# Patient Record
Sex: Female | Born: 1980 | Hispanic: No | Marital: Married | Smoking: Never smoker
Health system: Southern US, Community
[De-identification: ages and names within clinical notes are randomized; demographics above are authoritative.]

## PROBLEM LIST (undated history)

## (undated) ENCOUNTER — Inpatient Hospital Stay (HOSPITAL_COMMUNITY): Payer: Self-pay

## (undated) DIAGNOSIS — Z789 Other specified health status: Secondary | ICD-10-CM

---

## 2013-05-08 NOTE — L&D Delivery Note (Signed)
Delivery Note At 10:12 AM a healthy female was delivered via VBAC, Spontaneous (Presentation: ;  ).  APGAR: 9, 9; weight .   Placenta status: Intact, Spontaneous.  Cord: 3 vessels with the following complications: None.  Cord pH: Not done  Anesthesia: Epidural  Episiotomy: None Lacerations: 2nd degree Suture Repair: 2.0 3.0 vicryl rapide Est. Blood Loss (mL): 400  Mom to postpartum.  Baby to Couplet care / Skin to Skin.  Shannon Kirkendall,MARIE-LYNE 01/02/2014, 11:00 AM

## 2013-08-15 LAB — OB RESULTS CONSOLE HEPATITIS B SURFACE ANTIGEN: Hepatitis B Surface Ag: NEGATIVE

## 2013-08-15 LAB — OB RESULTS CONSOLE RPR: RPR: NONREACTIVE

## 2013-08-15 LAB — OB RESULTS CONSOLE ANTIBODY SCREEN: Antibody Screen: NEGATIVE

## 2013-08-15 LAB — OB RESULTS CONSOLE HIV ANTIBODY (ROUTINE TESTING)
HIV: NONREACTIVE
HIV: NONREACTIVE

## 2013-08-15 LAB — OB RESULTS CONSOLE RUBELLA ANTIBODY, IGM: Rubella: IMMUNE

## 2013-08-21 LAB — OB RESULTS CONSOLE GC/CHLAMYDIA
Chlamydia: NEGATIVE
Chlamydia: NEGATIVE
GC PROBE AMP, GENITAL: NEGATIVE
Gonorrhea: NEGATIVE

## 2013-09-01 LAB — OB RESULTS CONSOLE ABO/RH: RH Type: POSITIVE

## 2013-10-21 LAB — OB RESULTS CONSOLE GBS: STREP GROUP B AG: NEGATIVE

## 2013-10-21 LAB — OB RESULTS CONSOLE RPR: RPR: NONREACTIVE

## 2013-12-29 ENCOUNTER — Other Ambulatory Visit: Payer: Self-pay | Admitting: Obstetrics & Gynecology

## 2014-01-01 ENCOUNTER — Inpatient Hospital Stay (HOSPITAL_COMMUNITY)
Admission: AD | Admit: 2014-01-01 | Discharge: 2014-01-04 | DRG: 775 | Disposition: A | Discharge status: Home / Self Care | Payer: 59 | Source: Ambulatory Visit | Attending: Obstetrics & Gynecology | Admitting: Obstetrics & Gynecology

## 2014-01-01 ENCOUNTER — Encounter (HOSPITAL_COMMUNITY): Payer: Self-pay

## 2014-01-01 DIAGNOSIS — O34219 Maternal care for unspecified type scar from previous cesarean delivery: Secondary | ICD-10-CM | POA: Diagnosis present

## 2014-01-01 DIAGNOSIS — O99891 Other specified diseases and conditions complicating pregnancy: Secondary | ICD-10-CM | POA: Diagnosis present

## 2014-01-01 DIAGNOSIS — D62 Acute posthemorrhagic anemia: Secondary | ICD-10-CM | POA: Diagnosis present

## 2014-01-01 DIAGNOSIS — O9903 Anemia complicating the puerperium: Secondary | ICD-10-CM | POA: Diagnosis present

## 2014-01-01 HISTORY — DX: Other specified health status: Z78.9

## 2014-01-01 LAB — CBC
HEMATOCRIT: 37.8 % (ref 36.0–46.0)
Hemoglobin: 12.2 g/dL (ref 12.0–15.0)
MCH: 30.5 pg (ref 26.0–34.0)
MCHC: 32.3 g/dL (ref 30.0–36.0)
MCV: 94.5 fL (ref 78.0–100.0)
PLATELETS: 168 10*3/uL (ref 150–400)
RBC: 4 MIL/uL (ref 3.87–5.11)
RDW: 13.7 % (ref 11.5–15.5)
WBC: 9.3 10*3/uL (ref 4.0–10.5)

## 2014-01-01 LAB — TYPE AND SCREEN
ABO/RH(D): B POS
Antibody Screen: NEGATIVE

## 2014-01-01 MED ORDER — PHENYLEPHRINE 40 MCG/ML (10ML) SYRINGE FOR IV PUSH (FOR BLOOD PRESSURE SUPPORT)
80.0000 ug | PREFILLED_SYRINGE | INTRAVENOUS | Status: DC | PRN
  Filled 2014-01-01: qty 2

## 2014-01-01 MED ORDER — LIDOCAINE HCL (PF) 1 % IJ SOLN
30.0000 mL | INTRAMUSCULAR | Status: DC | PRN
  Administered 2014-01-02: 30 mL via SUBCUTANEOUS
  Filled 2014-01-01: qty 30

## 2014-01-01 MED ORDER — PHENYLEPHRINE 40 MCG/ML (10ML) SYRINGE FOR IV PUSH (FOR BLOOD PRESSURE SUPPORT)
80.0000 ug | PREFILLED_SYRINGE | INTRAVENOUS | Status: DC | PRN
  Filled 2014-01-01: qty 2
  Filled 2014-01-01: qty 10

## 2014-01-01 MED ORDER — DIPHENHYDRAMINE HCL 50 MG/ML IJ SOLN: 12.5000 mg | INTRAMUSCULAR | Status: DC | PRN

## 2014-01-01 MED ORDER — EPHEDRINE 5 MG/ML INJ
10.0000 mg | INTRAVENOUS | Status: DC | PRN
  Filled 2014-01-01: qty 2

## 2014-01-01 MED ORDER — OXYTOCIN 40 UNITS IN LACTATED RINGERS INFUSION - SIMPLE MED
62.5000 mL/h | INTRAVENOUS | Status: DC
  Administered 2014-01-02: 62.5 mL/h via INTRAVENOUS

## 2014-01-01 MED ORDER — FENTANYL 2.5 MCG/ML BUPIVACAINE 1/10 % EPIDURAL INFUSION (WH - ANES)
14.0000 mL/h | INTRAMUSCULAR | Status: DC | PRN
  Administered 2014-01-02: 14 mL/h via EPIDURAL
  Filled 2014-01-01 (×2): qty 125

## 2014-01-01 MED ORDER — IBUPROFEN 600 MG PO TABS: 600.0000 mg | ORAL_TABLET | Freq: Four times a day (QID) | ORAL | Status: DC | PRN

## 2014-01-01 MED ORDER — LACTATED RINGERS IV SOLN
INTRAVENOUS | Status: DC
  Administered 2014-01-02 (×2): via INTRAVENOUS

## 2014-01-01 MED ORDER — OXYTOCIN BOLUS FROM INFUSION: 500.0000 mL | INTRAVENOUS | Status: DC

## 2014-01-01 MED ORDER — ACETAMINOPHEN 325 MG PO TABS: 650.0000 mg | ORAL_TABLET | ORAL | Status: DC | PRN

## 2014-01-01 MED ORDER — CITRIC ACID-SODIUM CITRATE 334-500 MG/5ML PO SOLN
30.0000 mL | ORAL | Status: DC | PRN
  Filled 2014-01-01: qty 15

## 2014-01-01 MED ORDER — OXYCODONE-ACETAMINOPHEN 5-325 MG PO TABS: 1.0000 | ORAL_TABLET | ORAL | Status: DC | PRN

## 2014-01-01 MED ORDER — LACTATED RINGERS IV SOLN
500.0000 mL | Freq: Once | INTRAVENOUS | Status: AC
  Administered 2014-01-02: 500 mL via INTRAVENOUS

## 2014-01-01 MED ORDER — LACTATED RINGERS IV SOLN: 500.0000 mL | INTRAVENOUS | Status: DC | PRN

## 2014-01-01 MED ORDER — OXYTOCIN 40 UNITS IN LACTATED RINGERS INFUSION - SIMPLE MED
1.0000 m[IU]/min | INTRAVENOUS | Status: DC
  Administered 2014-01-01: 4 m[IU]/min via INTRAVENOUS
  Administered 2014-01-01: 2 m[IU]/min via INTRAVENOUS
  Administered 2014-01-01: 6 m[IU]/min via INTRAVENOUS
  Filled 2014-01-01: qty 1000

## 2014-01-01 MED ORDER — TERBUTALINE SULFATE 1 MG/ML IJ SOLN: 0.2500 mg | Freq: Once | INTRAMUSCULAR | Status: AC | PRN

## 2014-01-01 MED ORDER — ONDANSETRON HCL 4 MG/2ML IJ SOLN: 4.0000 mg | Freq: Four times a day (QID) | INTRAMUSCULAR | Status: DC | PRN

## 2014-01-01 NOTE — H&P (Signed)
Sandra Berry is a 33 y.o. female G2P1 [redacted]w[redacted]d presenting for AF leak.  HPI:  Clear AF leak x this am.  Mild irregular UCs.  Good FMs.  No vaginal bleeding.  No PIH Sxs.  OB History   Grav Para Term Preterm Abortions TAB SAB Ect Mult Living       History reviewed. No pertinent past medical history. Past Surgical History  Procedure Laterality Date  . Cesarean section     Family History: family history is not on file. Social History:  reports that she has never smoked. She does not have any smokeless tobacco history on file. Her alcohol and drug histories are not on file. No current facility-administered medications for this encounter. No Known Allergies  Dilation: 2 Effacement (%): 80 Station: -1 Exam by:: Dr. Seymour Bars Blood pressure 81/59, pulse 83, temperature 98.2 F (36.8 C), temperature source Oral, resp. rate 16, height  (1.549 m), weight 69.854 kg (154 lb). Exam Physical Exam  AF clear FHR 130's accelerations present, no deceleration UCs q3-5 min mild  HPP: There are no active problems to display for this patient.   Prenatal labs: ABO, Rh: B/Positive/-- (04/27 0000) Antibody: Negative (04/10 0000) Rubella:  Immune RPR: Nonreactive (06/16 0000)  HBsAg: Negative (04/10 0000)  HIV: Non-reactive, Non-reactive (04/10 0000)  Genetic testing: Not done, 1st visit at 17+ wks [place] anato: wnl 1 hr GTT: wnl GBS: Negative (06/16 0000)  Last [place] EFW 76%, AFI wnl  Assessment/Plan: 38+ wks SROM clear in early labor.  Pitocin augmentation.  Fetal well-being reassuring.  Attempting VBAC.  Monitoring.  Epidural PRN.   Tiffony Kite,MARIE-LYNE 01/01/2014, 8:04 PM

## 2014-01-02 ENCOUNTER — Encounter (HOSPITAL_COMMUNITY): Payer: Self-pay | Admitting: General Practice

## 2014-01-02 ENCOUNTER — Encounter (HOSPITAL_COMMUNITY): Payer: 59 | Admitting: Anesthesiology

## 2014-01-02 ENCOUNTER — Inpatient Hospital Stay (HOSPITAL_COMMUNITY): Payer: 59 | Admitting: Anesthesiology

## 2014-01-02 LAB — ABO/RH: ABO/RH(D): B POS

## 2014-01-02 LAB — RPR

## 2014-01-02 MED ORDER — SIMETHICONE 80 MG PO CHEW: 80.0000 mg | CHEWABLE_TABLET | ORAL | Status: DC | PRN

## 2014-01-02 MED ORDER — WITCH HAZEL-GLYCERIN EX PADS: 1.0000 "application " | MEDICATED_PAD | CUTANEOUS | Status: DC | PRN

## 2014-01-02 MED ORDER — BENZOCAINE-MENTHOL 20-0.5 % EX AERO
1.0000 | INHALATION_SPRAY | CUTANEOUS | Status: DC | PRN
Start: 2014-01-02 — End: 2014-01-04
  Administered 2014-01-02: 1 via TOPICAL
  Filled 2014-01-02: qty 56

## 2014-01-02 MED ORDER — ZOLPIDEM TARTRATE 5 MG PO TABS: 5.0000 mg | ORAL_TABLET | Freq: Every evening | ORAL | Status: DC | PRN

## 2014-01-02 MED ORDER — LIDOCAINE HCL (PF) 1 % IJ SOLN
INTRAMUSCULAR | Status: DC | PRN
  Administered 2014-01-02 (×2): 8 mL

## 2014-01-02 MED ORDER — ONDANSETRON HCL 4 MG PO TABS
4.0000 mg | ORAL_TABLET | ORAL | Status: DC | PRN
Start: 2014-01-02 — End: 2014-01-03

## 2014-01-02 MED ORDER — LANOLIN HYDROUS EX OINT: TOPICAL_OINTMENT | CUTANEOUS | Status: DC | PRN

## 2014-01-02 MED ORDER — IBUPROFEN 600 MG PO TABS
600.0000 mg | ORAL_TABLET | Freq: Four times a day (QID) | ORAL | Status: DC
  Administered 2014-01-02 – 2014-01-04 (×7): 600 mg via ORAL
  Filled 2014-01-02 (×8): qty 1

## 2014-01-02 MED ORDER — OXYTOCIN 40 UNITS IN LACTATED RINGERS INFUSION - SIMPLE MED
62.5000 mL/h | INTRAVENOUS | Status: DC | PRN
Start: 2014-01-02 — End: 2014-01-03

## 2014-01-02 MED ORDER — OXYCODONE-ACETAMINOPHEN 5-325 MG PO TABS
1.0000 | ORAL_TABLET | ORAL | Status: DC | PRN
  Administered 2014-01-02 – 2014-01-04 (×5): 1 via ORAL
  Filled 2014-01-02 (×6): qty 1

## 2014-01-02 MED ORDER — FENTANYL 2.5 MCG/ML BUPIVACAINE 1/10 % EPIDURAL INFUSION (WH - ANES)
INTRAMUSCULAR | Status: DC | PRN
  Administered 2014-01-02: 14 mL/h via EPIDURAL

## 2014-01-02 MED ORDER — TETANUS-DIPHTH-ACELL PERTUSSIS 5-2.5-18.5 LF-MCG/0.5 IM SUSP
0.5000 mL | Freq: Once | INTRAMUSCULAR | Status: DC
Start: 2014-01-03 — End: 2014-01-03

## 2014-01-02 MED ORDER — DIPHENHYDRAMINE HCL 25 MG PO CAPS
25.0000 mg | ORAL_CAPSULE | Freq: Four times a day (QID) | ORAL | Status: DC | PRN
Start: 2014-01-02 — End: 2014-01-04

## 2014-01-02 MED ORDER — PRENATAL MULTIVITAMIN CH
1.0000 | ORAL_TABLET | Freq: Every day | ORAL | Status: DC
Start: 2014-01-02 — End: 2014-01-04
  Administered 2014-01-03: 1 via ORAL
  Filled 2014-01-02: qty 1

## 2014-01-02 MED ORDER — DIBUCAINE 1 % RE OINT: 1.0000 "application " | TOPICAL_OINTMENT | RECTAL | Status: DC | PRN

## 2014-01-02 MED ORDER — ONDANSETRON HCL 4 MG/2ML IJ SOLN
4.0000 mg | INTRAMUSCULAR | Status: DC | PRN
Start: 2014-01-02 — End: 2014-01-03

## 2014-01-02 MED ORDER — SENNOSIDES-DOCUSATE SODIUM 8.6-50 MG PO TABS
2.0000 | ORAL_TABLET | ORAL | Status: DC
  Administered 2014-01-02 – 2014-01-04 (×2): 2 via ORAL
  Filled 2014-01-02 (×2): qty 2

## 2014-01-02 NOTE — Lactation Note (Signed)
This note was copied from the chart of Sandra Tymara Saur. Lactation Consultation Note Initial visit at 7 hours of age.  Family interpreter at bedside.  Baby sleepy in visitors arms.   Mom reports baby just breastfed for 10 minutes and denies pain.  Mom is very sleepy with eyes closing.  [facility] resources papers left with pt. And encouraged to call with feeding for latch assessment.    Patient Name: Sandra Berry ZOXWR'[id number] Date: 01/02/2014 Reason for consult: Initial assessment   Maternal Data Has patient been taught Hand Expression?: Yes Does the patient have breastfeeding experience prior to this delivery?: No  Feeding Feeding Type: Breast Fed Length of feed: 10 min  LATCH Score/Interventions Latch: Grasps breast easily, tongue down, lips flanged, rhythmical sucking. Intervention(s): Adjust position;Assist with latch  Audible Swallowing: A few with stimulation Intervention(s): Skin to skin  Type of Nipple: Everted at rest and after stimulation  Comfort (Breast/Nipple): Soft / non-tender     Hold (Positioning): Assistance needed to correctly position infant at breast and maintain latch. Intervention(s): Support Pillows;Skin to skin  LATCH Score: 8  Lactation Tools Discussed/Used     Consult Status Consult Status: Follow-up Date: 01/03/14 Follow-up type: In-patient    Beverely Risen Arvella Merles 01/02/2014, 6:33 PM

## 2014-01-02 NOTE — Anesthesia Procedure Notes (Signed)
Epidural Patient location during procedure: OB Start time: 01/02/2014 1:41 AM End time: 01/02/2014 1:45 AM  Staffing Anesthesiologist: Leilani Able Performed by: anesthesiologist   Preanesthetic Checklist Completed: patient identified, surgical consent, pre-op evaluation, timeout performed, IV checked, risks and benefits discussed and monitors and equipment checked  Epidural Patient position: sitting Prep: site prepped and draped and DuraPrep Patient monitoring: continuous pulse ox and blood pressure Approach: midline Location: L3-L4 Injection technique: LOR air  Needle:  Needle type: Tuohy  Needle gauge: 17 G Needle length: 9 cm and 9 Needle insertion depth: 4 cm Catheter type: closed end flexible Catheter size: 19 Gauge Catheter at skin depth: 9 cm Test dose: negative and Other  Assessment Sensory level: T9 Events: blood not aspirated, injection not painful, no injection resistance, negative IV test and no paresthesia  Additional Notes Reason for block:procedure for pain

## 2014-01-02 NOTE — Anesthesia Preprocedure Evaluation (Signed)

## 2014-01-03 LAB — CBC
HCT: 26.9 % — ABNORMAL LOW (ref 36.0–46.0)
Hemoglobin: 9.1 g/dL — ABNORMAL LOW (ref 12.0–15.0)
MCH: 32 pg (ref 26.0–34.0)
MCHC: 33.8 g/dL (ref 30.0–36.0)
MCV: 94.7 fL (ref 78.0–100.0)
PLATELETS: 117 10*3/uL — AB (ref 150–400)
RBC: 2.84 MIL/uL — ABNORMAL LOW (ref 3.87–5.11)
RDW: 13.8 % (ref 11.5–15.5)
WBC: 13.1 10*3/uL — ABNORMAL HIGH (ref 4.0–10.5)

## 2014-01-03 MED ORDER — POLYSACCHARIDE IRON COMPLEX 150 MG PO CAPS
150.0000 mg | ORAL_CAPSULE | Freq: Every day | ORAL | Status: DC
  Administered 2014-01-03 – 2014-01-04 (×2): 150 mg via ORAL
  Filled 2014-01-03 (×2): qty 1

## 2014-01-03 NOTE — Progress Notes (Signed)
PPD 1 SVD  S:  Spouse reports patient feeling very tired but no pain / sleeping at intervals this AM             Tolerating po/ No nausea or vomiting             Up ad lib / ambulatory / voiding QS  Newborn breast feeding   O:               VS: BP 93/57  Pulse 60  Temp(Src) 98 F (36.7 C) (Oral)  Resp 18  Ht  (1.549 m)  Wt 69.854 kg (154 lb)  BMI 29.11 kg/m2  SpO2 97%  Breastfeeding? Unknown   LABS:              Recent Labs  01/01/14 1900 01/03/14 0620  WBC 9.3 13.1*  HGB 12.2 9.1*  PLT 168 117*               Blood type: --/--/B POS, B POS (08/27 1900)  Rubella: Immune (04/10 0000)                     I&O: Intake/Output     08/28 0701 - 08/29 0700 08/29 0701 - 08/30 0700   Blood 400    Total Output 400     Net -400                        Physical Exam:             Alert and oriented X3  Lungs: Clear and unlabored  Heart: regular rate and rhythm / no mumurs  Abdomen: soft, non-tender, non-distended              Fundus: firm, non-tender, Ueven  Perineum: ice pack in place  Lochia: light  Extremities: no edema, no calf pain or tenderness    A: PPD # 1              ABL anemia  Doing well - stable status  P: Routine post partum orders  Start iron             Anticipate DC tomorrow  Marlinda Mike CNM, MSN, FACNM 01/03/2014, 10:55 AM

## 2014-01-03 NOTE — Anesthesia Postprocedure Evaluation (Signed)
  Anesthesia Post-op Note  Patient: Sandra Berry  Procedure(s) Performed: * No procedures listed *  Patient Location: PACU and Mother/Baby  Anesthesia Type:Epidural  Level of Consciousness: awake, alert  and oriented  Airway and Oxygen Therapy: Patient Spontanous Breathing  Post-op Pain: mild  Post-op Assessment: Patient's Cardiovascular Status Stable, Respiratory Function Stable, No signs of Nausea or vomiting, Pain level controlled and No headache  Post-op Vital Signs: Reviewed and stable  Last Vitals:  Filed Vitals:   01/03/14 0534  BP: 93/57  Pulse: 60  Temp: 36.7 C  Resp: 18    Complications: No apparent anesthesia complications

## 2014-01-03 NOTE — Lactation Note (Signed)
This note was copied from the chart of Sandra Berry. Lactation Consultation Note  Patient Name: Sandra Berry Date: 01/03/2014 Reason for consult: Follow-up assessment with RN, Steward Drone who reports that she has spoken with this mom and mom informs her that she wants to formula/bottle-feed in hospital and has been encouraged to put baby to breast.  Baby has received only formula feedings since last night.   Maternal Data    Feeding Feeding Type: Bottle Fed - Formula  LATCH Score/Interventions         LATCH score=9 at most recent breastfeeding around 0100 this morning             Lactation Tools Discussed/Used   N/A - mom not willing to breastfeed at this time (per RN report)  Consult Status Consult Status: PRN Date: 01/04/14 Follow-up type: In-patient    Warrick Parisian [facility] 01/03/2014, 8:41 PM

## 2014-01-03 NOTE — Progress Notes (Signed)
Offered assistance to patient with breastfeeding. Through interpreter patient statement was made that "I do not have enough milk for baby". Patient given information that it was ok to breast feed the infant and then supplement with formula afterwards. Patient declined and stated through interpreter "she would nurse when she went home from hospital and last time she had no breast changes".

## 2014-01-03 NOTE — Anesthesia Postprocedure Evaluation (Deleted)
  Anesthesia Post-op Note  Patient: Sandra Berry  Procedure(s) Performed: * No procedures listed *  Patient Location: Mother/Baby  Anesthesia Type:Epidural  Level of Consciousness: awake and alert   Airway and Oxygen Therapy: Patient Spontanous Breathing  Post-op Pain: mild  Post-op Assessment: Post-op Vital signs reviewed, No signs of Nausea or vomiting, No backache, No residual numbness and No residual motor weakness  Post-op Vital Signs: Reviewed  Last Vitals:  Filed Vitals:   01/03/14 0534  BP: 93/57  Pulse: 60  Temp: 36.7 C  Resp: 18    Complications: No apparent anesthesia complications

## 2014-01-04 DIAGNOSIS — O34219 Maternal care for unspecified type scar from previous cesarean delivery: Secondary | ICD-10-CM | POA: Diagnosis not present

## 2014-01-04 MED ORDER — OXYCODONE-ACETAMINOPHEN 5-325 MG PO TABS: 1.0000 | ORAL_TABLET | ORAL | Status: DC | PRN

## 2014-01-04 MED ORDER — POLYSACCHARIDE IRON COMPLEX 150 MG PO CAPS: 150.0000 mg | ORAL_CAPSULE | Freq: Every day | ORAL | Status: DC

## 2014-01-04 MED ORDER — IBUPROFEN 600 MG PO TABS: 600.0000 mg | ORAL_TABLET | Freq: Four times a day (QID) | ORAL | Status: DC

## 2014-01-04 NOTE — Discharge Summary (Signed)
Obstetric Discharge Summary  Reason for Admission: onset of labor Prenatal Procedures: none Intrapartum Procedures: spontaneous vaginal delivery after TOLAC Postpartum Procedures: none Complications-Operative and Postpartum: 2nd degree perineal laceration Hemoglobin  Date Value Ref Range Status  01/03/2014 9.1* 12.0 - 15.0 g/dL Final     REPEATED TO VERIFY     DELTA CHECK NOTED     HCT  Date Value Ref Range Status  01/03/2014 26.9* 36.0 - 46.0 % Final    Physical Exam:  General: alert, cooperative and no distress Lochia: appropriate Uterine Fundus: firm Incision: healing well DVT Evaluation: No evidence of DVT seen on physical exam.  Discharge Diagnoses: Term Pregnancy-delivered and VBAC  Discharge Information: Date: 01/04/2014 Activity: pelvic rest Diet: routine Medications: PNV, Ibuprofen, Iron and Percocet Condition: stable Instructions: refer to practice specific booklet Discharge to: home Follow-up Information   Follow up with LAVOIE,MARIE-LYNE, MD. Schedule an appointment as soon as possible for a visit in 6 weeks.   Specialty:  Obstetrics and Gynecology   Contact information:   [address] [place] [address] [phone]       Newborn Data: Live born female  Birth Weight: 8 lb 6.6 oz (3815 g) APGAR: 9, 9  Home with mother.  Marlinda Mike 01/04/2014, 9:04 AM

## 2014-01-04 NOTE — Progress Notes (Signed)
PPD 2 SVD Visit with official translater at bedside  S:  Reports feeling well             Tolerating po/ No nausea or vomiting             Bleeding is light             Pain controlled with pain medicine             Up ad lib / ambulatory / voiding QS  Newborn breast feeding  O:               VS: BP 98/64  Pulse 84  Temp(Src) 97.7 F (36.5 C) (Oral)  Resp 19  Ht  (1.549 m)  Wt 69.854 kg (154 lb)  BMI 29.11 kg/m2  SpO2 99%  Breastfeeding? Unknown   LABS:              Recent Labs  01/01/14 1900 01/03/14 0620  WBC 9.3 13.1*  HGB 12.2 9.1*  PLT 168 117*               Blood type: --/--/B POS, B POS (08/27 1900)  Rubella: Immune (04/10 0000)                      Physical Exam:             Alert and oriented X3  Fundus: firm, non-tender, U-1  Perineum: no edema  Lochia: light  Extremities: no edema, no calf pain or tenderness    A: PPD # 2              ABL anemia - stable  Doing well - stable status  P: Routine post partum orders  DC home  Marlinda Mike CNM, MSN, FACNM 01/04/2014, 9:00 AM

## 2014-01-08 ENCOUNTER — Other Ambulatory Visit (HOSPITAL_COMMUNITY): Payer: Self-pay

## 2014-01-09 ENCOUNTER — Inpatient Hospital Stay (HOSPITAL_COMMUNITY): Admission: AD | Admit: 2014-01-09 | Payer: 59 | Source: Ambulatory Visit | Admitting: Obstetrics and Gynecology

## 2014-01-09 SURGERY — Surgical Case
Anesthesia: Spinal

## 2014-03-09 ENCOUNTER — Encounter (HOSPITAL_COMMUNITY): Payer: Self-pay | Admitting: General Practice

## 2016-05-08 NOTE — L&D Delivery Note (Signed)
Delivery Note At  a viable female was delivered over intact perineum via vbac (Presentation:LOA ;  ).  APGAR: 9,9 ; weight not yet done  .   Placenta status:delivered spontaneously, intact , .  Cord:  3 vv with the following complications: none.  Anesthesia:  epidural Episiotomy:  none Lacerations:  2nd degree Suture Repair: 3.0 vicryl; hemostatic after repair Est. Blood Loss (mL):  400ml Uterine tone - firm Mom to postpartum.  Baby to Couplet care / Skin to Skin.  Vick FreesSusan E Almquist 03/31/2017, 2:04 PM

## 2016-09-11 LAB — OB RESULTS CONSOLE GC/CHLAMYDIA
CHLAMYDIA, DNA PROBE: NEGATIVE
GC PROBE AMP, GENITAL: NEGATIVE

## 2016-09-11 LAB — OB RESULTS CONSOLE RPR: RPR: NONREACTIVE

## 2016-09-11 LAB — OB RESULTS CONSOLE HEPATITIS B SURFACE ANTIGEN: Hepatitis B Surface Ag: NEGATIVE

## 2016-09-11 LAB — OB RESULTS CONSOLE HIV ANTIBODY (ROUTINE TESTING): HIV: NONREACTIVE

## 2016-09-11 LAB — OB RESULTS CONSOLE ABO/RH: RH Type: POSITIVE

## 2016-09-11 LAB — OB RESULTS CONSOLE ANTIBODY SCREEN: Antibody Screen: NEGATIVE

## 2016-09-11 LAB — OB RESULTS CONSOLE RUBELLA ANTIBODY, IGM: RUBELLA: IMMUNE

## 2017-02-17 ENCOUNTER — Inpatient Hospital Stay (HOSPITAL_COMMUNITY)
Admission: AD | Admit: 2017-02-17 | Discharge: 2017-02-17 | Disposition: A | Discharge status: Home / Self Care | Payer: BLUE CROSS/BLUE SHIELD | Source: Ambulatory Visit | Attending: Obstetrics and Gynecology | Admitting: Obstetrics and Gynecology

## 2017-02-17 ENCOUNTER — Inpatient Hospital Stay (HOSPITAL_COMMUNITY): Payer: BLUE CROSS/BLUE SHIELD

## 2017-02-17 ENCOUNTER — Encounter (HOSPITAL_COMMUNITY): Payer: Self-pay | Admitting: *Deleted

## 2017-02-17 DIAGNOSIS — Z3A34 34 weeks gestation of pregnancy: Secondary | ICD-10-CM | POA: Insufficient documentation

## 2017-02-17 DIAGNOSIS — R350 Frequency of micturition: Secondary | ICD-10-CM | POA: Insufficient documentation

## 2017-02-17 DIAGNOSIS — O4693 Antepartum hemorrhage, unspecified, third trimester: Secondary | ICD-10-CM | POA: Insufficient documentation

## 2017-02-17 LAB — URINALYSIS, ROUTINE W REFLEX MICROSCOPIC
Bilirubin Urine: NEGATIVE
Glucose, UA: NEGATIVE mg/dL
Hgb urine dipstick: NEGATIVE
Ketones, ur: NEGATIVE mg/dL
Leukocytes, UA: NEGATIVE
NITRITE: NEGATIVE
Protein, ur: NEGATIVE mg/dL
SPECIFIC GRAVITY, URINE: 1.009 (ref 1.005–1.030)
pH: 8 (ref 5.0–8.0)

## 2017-02-17 NOTE — MAU Note (Signed)
Pt states she does not want to wait for u/s. I've discussed she would be signing out AMA if she were to leave.

## 2017-02-17 NOTE — Discharge Instructions (Signed)
Call Monday for urine culture results, call if increased vaginal bleeding, call if six or more contractions per hours, decreased fetal movement or rupture of membrane

## 2017-02-17 NOTE — MAU Provider Note (Signed)
History     Chief Complaint  Patient presents with  . Abdominal Pain  . Vaginal Bleeding   36 yo G3P200@ Asian female @ 34 2/[redacted] weeks gestation presents with c/o lower abdominal pain on and off for few days. (+) low back pain. Noted some vaginal blood on panty. Pt also c/o blood in the urine and low back pain. Denies dysuria (+) urinary frequency. (+) FM  OB History    Gravida Para Term Preterm AB Living   0 0 2   SAB TAB Ectopic Multiple Live Births   0 0 0 0 2      Past Medical History:  Diagnosis Date  . Indication for care in labor or delivery 01/02/2014  . Medical history non-contributory     Past Surgical History:  Procedure Laterality Date  . CESAREAN SECTION      History reviewed. No pertinent family history.  Social History  Substance Use Topics  . Smoking status: Never Smoker  . Smokeless tobacco: Never Used  . Alcohol use Not on file    Allergies: No Known Allergies  Prescriptions Prior to Admission  Medication Sig Dispense Refill Last Dose  . ibuprofen (ADVIL,MOTRIN) 600 MG tablet Take 1 tablet (600 mg total) by mouth every 6 (six) hours. 30 tablet 0   . iron polysaccharides (NIFEREX) 150 MG capsule Take 1 capsule (150 mg total) by mouth daily. 30 capsule 0   . oxyCODONE-acetaminophen (PERCOCET/ROXICET) 5-325 MG per tablet Take 1 tablet by mouth every 4 (four) hours as needed for severe pain. 15 tablet 0   . Prenatal Vit-Fe Fumarate-FA (PRENATAL MULTIVITAMIN) TABS tablet Take 1 tablet by mouth daily at 12 noon.   Past Month at Unknown time     Physical Exam   Blood pressure (!) 97/49, pulse 74, temperature 97.7 F (36.5 C), temperature source Oral, resp. rate 18, height 5' (1.524 m), weight 67.9 kg (149 lb 12.8 oz), unknown if currently breastfeeding.  General appearance: alert, cooperative and no distress Back: no tenderness to percussion or palpation Lungs: clear to auscultation bilaterally Heart: regular rate and rhythm, S1, S2 normal, no  murmur, click, rub or gallop Abdomen: gravid soft ? suprapubic tenderness Pelvic: cervix normal in appearance, external genitalia normal and scant old brown blood. cervix external os 1cm/internal os closed/long/pp oop Extremities: no edema, redness or tenderness in the calves or thighs   Tracing: baseline 120 (+) accel. Rare ctx  IMP: urinary frequency 3rd trimester vaginal bleeding IUP@ 34 2/7 week P) u/a, ucx. OB sonogram ED Course   MDM Addendum:  Sono: no evidence of abruption, 4lb 9 oz( 32%) nl fluid. Advised to keep sched OB appt. PTL prec given  Farhaan Mabee A, MD 2:19 PM 02/17/2017

## 2017-02-17 NOTE — MAU Note (Signed)
Pt c/o abd pain and cramping(q 20 min) on and off for the past 3 days. Reports some vag bleeding when she wipes

## 2017-02-18 LAB — URINE CULTURE
Culture: <10000 — AB
Special Requests: NORMAL

## 2017-02-28 LAB — OB RESULTS CONSOLE GBS: GBS: NEGATIVE

## 2017-03-26 ENCOUNTER — Encounter (HOSPITAL_COMMUNITY): Payer: Self-pay | Admitting: *Deleted

## 2017-03-26 ENCOUNTER — Telehealth (HOSPITAL_COMMUNITY): Payer: Self-pay | Admitting: *Deleted

## 2017-03-26 NOTE — Telephone Encounter (Signed)
Preadmission screen Interpreter number [phone]

## 2017-03-26 NOTE — Telephone Encounter (Signed)
Preadmission screen  

## 2017-03-27 ENCOUNTER — Other Ambulatory Visit: Payer: Self-pay | Admitting: Obstetrics and Gynecology

## 2017-03-31 ENCOUNTER — Encounter (HOSPITAL_COMMUNITY): Payer: Self-pay | Admitting: Anesthesiology

## 2017-03-31 ENCOUNTER — Inpatient Hospital Stay (HOSPITAL_COMMUNITY)
Admission: RE | Admit: 2017-03-31 | Discharge: 2017-04-01 | DRG: 807 | Disposition: A | Discharge status: Home / Self Care | Payer: BLUE CROSS/BLUE SHIELD | Source: Ambulatory Visit | Attending: Obstetrics and Gynecology | Admitting: Obstetrics and Gynecology

## 2017-03-31 ENCOUNTER — Encounter (HOSPITAL_COMMUNITY): Payer: Self-pay

## 2017-03-31 ENCOUNTER — Inpatient Hospital Stay (HOSPITAL_COMMUNITY): Payer: BLUE CROSS/BLUE SHIELD | Admitting: Anesthesiology

## 2017-03-31 DIAGNOSIS — O34219 Maternal care for unspecified type scar from previous cesarean delivery: Secondary | ICD-10-CM | POA: Diagnosis present

## 2017-03-31 DIAGNOSIS — O9912 Other diseases of the blood and blood-forming organs and certain disorders involving the immune mechanism complicating childbirth: Secondary | ICD-10-CM | POA: Diagnosis present

## 2017-03-31 DIAGNOSIS — D696 Thrombocytopenia, unspecified: Secondary | ICD-10-CM | POA: Diagnosis present

## 2017-03-31 DIAGNOSIS — O26893 Other specified pregnancy related conditions, third trimester: Secondary | ICD-10-CM | POA: Diagnosis present

## 2017-03-31 DIAGNOSIS — Z3490 Encounter for supervision of normal pregnancy, unspecified, unspecified trimester: Secondary | ICD-10-CM

## 2017-03-31 DIAGNOSIS — Z3A40 40 weeks gestation of pregnancy: Secondary | ICD-10-CM | POA: Diagnosis not present

## 2017-03-31 LAB — TYPE AND SCREEN
ABO/RH(D): B POS
Antibody Screen: NEGATIVE

## 2017-03-31 LAB — CBC
HCT: 38.1 % (ref 36.0–46.0)
HEMOGLOBIN: 12.5 g/dL (ref 12.0–15.0)
MCH: 31.8 pg (ref 26.0–34.0)
MCHC: 32.8 g/dL (ref 30.0–36.0)
MCV: 96.9 fL (ref 78.0–100.0)
Platelets: 152 10*3/uL (ref 150–400)
RBC: 3.93 MIL/uL (ref 3.87–5.11)
RDW: 13.8 % (ref 11.5–15.5)
WBC: 6.6 10*3/uL (ref 4.0–10.5)

## 2017-03-31 LAB — RPR: RPR: NONREACTIVE

## 2017-03-31 MED ORDER — LACTATED RINGERS IV SOLN
500.0000 mL | Freq: Once | INTRAVENOUS | Status: AC
  Administered 2017-03-31: 500 mL via INTRAVENOUS

## 2017-03-31 MED ORDER — LIDOCAINE HCL (PF) 1 % IJ SOLN
30.0000 mL | INTRAMUSCULAR | Status: DC | PRN
  Filled 2017-03-31: qty 30

## 2017-03-31 MED ORDER — LACTATED RINGERS IV SOLN
INTRAVENOUS | Status: DC
  Administered 2017-03-31 (×2): via INTRAVENOUS

## 2017-03-31 MED ORDER — ONDANSETRON HCL 4 MG PO TABS: 4.0000 mg | ORAL_TABLET | ORAL | Status: DC | PRN

## 2017-03-31 MED ORDER — PHENYLEPHRINE 40 MCG/ML (10ML) SYRINGE FOR IV PUSH (FOR BLOOD PRESSURE SUPPORT)
80.0000 ug | PREFILLED_SYRINGE | INTRAVENOUS | Status: DC | PRN
  Filled 2017-03-31: qty 5
  Filled 2017-03-31: qty 10

## 2017-03-31 MED ORDER — OXYTOCIN 40 UNITS IN LACTATED RINGERS INFUSION - SIMPLE MED
1.0000 m[IU]/min | INTRAVENOUS | Status: DC
  Administered 2017-03-31: 2 m[IU]/min via INTRAVENOUS

## 2017-03-31 MED ORDER — PHENYLEPHRINE 40 MCG/ML (10ML) SYRINGE FOR IV PUSH (FOR BLOOD PRESSURE SUPPORT)
80.0000 ug | PREFILLED_SYRINGE | INTRAVENOUS | Status: DC | PRN
  Filled 2017-03-31: qty 5

## 2017-03-31 MED ORDER — OXYTOCIN 40 UNITS IN LACTATED RINGERS INFUSION - SIMPLE MED: 2.5000 [IU]/h | INTRAVENOUS | Status: DC

## 2017-03-31 MED ORDER — OXYTOCIN 40 UNITS IN LACTATED RINGERS INFUSION - SIMPLE MED
INTRAVENOUS | Status: AC
  Filled 2017-03-31: qty 1000

## 2017-03-31 MED ORDER — OXYCODONE-ACETAMINOPHEN 5-325 MG PO TABS: 1.0000 | ORAL_TABLET | ORAL | Status: DC | PRN

## 2017-03-31 MED ORDER — ONDANSETRON HCL 4 MG/2ML IJ SOLN: 4.0000 mg | INTRAMUSCULAR | Status: DC | PRN

## 2017-03-31 MED ORDER — ACETAMINOPHEN 325 MG PO TABS
650.0000 mg | ORAL_TABLET | ORAL | Status: DC | PRN
  Administered 2017-04-01: 650 mg via ORAL
  Filled 2017-03-31: qty 2

## 2017-03-31 MED ORDER — ONDANSETRON HCL 4 MG/2ML IJ SOLN: 4.0000 mg | Freq: Four times a day (QID) | INTRAMUSCULAR | Status: DC | PRN

## 2017-03-31 MED ORDER — DIPHENHYDRAMINE HCL 25 MG PO CAPS: 25.0000 mg | ORAL_CAPSULE | Freq: Four times a day (QID) | ORAL | Status: DC | PRN

## 2017-03-31 MED ORDER — DIBUCAINE 1 % RE OINT: 1.0000 "application " | TOPICAL_OINTMENT | RECTAL | Status: DC | PRN

## 2017-03-31 MED ORDER — LACTATED RINGERS IV SOLN
500.0000 mL | INTRAVENOUS | Status: DC | PRN
Start: 2017-03-31 — End: 2017-03-31

## 2017-03-31 MED ORDER — OXYCODONE-ACETAMINOPHEN 5-325 MG PO TABS: 2.0000 | ORAL_TABLET | ORAL | Status: DC | PRN

## 2017-03-31 MED ORDER — ACETAMINOPHEN 325 MG PO TABS: 650.0000 mg | ORAL_TABLET | ORAL | Status: DC | PRN

## 2017-03-31 MED ORDER — SENNOSIDES-DOCUSATE SODIUM 8.6-50 MG PO TABS
2.0000 | ORAL_TABLET | ORAL | Status: DC
  Administered 2017-03-31: 2 via ORAL

## 2017-03-31 MED ORDER — SOD CITRATE-CITRIC ACID 500-334 MG/5ML PO SOLN: 30.0000 mL | ORAL | Status: DC | PRN

## 2017-03-31 MED ORDER — FLEET ENEMA 7-19 GM/118ML RE ENEM: 1.0000 | ENEMA | RECTAL | Status: DC | PRN

## 2017-03-31 MED ORDER — TETANUS-DIPHTH-ACELL PERTUSSIS 5-2.5-18.5 LF-MCG/0.5 IM SUSP: 0.5000 mL | Freq: Once | INTRAMUSCULAR | Status: DC

## 2017-03-31 MED ORDER — WITCH HAZEL-GLYCERIN EX PADS: 1.0000 "application " | MEDICATED_PAD | CUTANEOUS | Status: DC | PRN

## 2017-03-31 MED ORDER — PRENATAL MULTIVITAMIN CH
1.0000 | ORAL_TABLET | Freq: Every day | ORAL | Status: DC
  Administered 2017-04-01: 1 via ORAL
  Filled 2017-03-31: qty 1

## 2017-03-31 MED ORDER — SIMETHICONE 80 MG PO CHEW: 80.0000 mg | CHEWABLE_TABLET | ORAL | Status: DC | PRN

## 2017-03-31 MED ORDER — EPHEDRINE 5 MG/ML INJ
10.0000 mg | INTRAVENOUS | Status: DC | PRN
  Filled 2017-03-31: qty 2

## 2017-03-31 MED ORDER — IBUPROFEN 600 MG PO TABS
600.0000 mg | ORAL_TABLET | Freq: Four times a day (QID) | ORAL | Status: DC
  Administered 2017-03-31 – 2017-04-01 (×3): 600 mg via ORAL
  Filled 2017-03-31 (×3): qty 1

## 2017-03-31 MED ORDER — COCONUT OIL OIL: 1.0000 "application " | TOPICAL_OIL | Status: DC | PRN

## 2017-03-31 MED ORDER — LIDOCAINE HCL (PF) 1 % IJ SOLN
INTRAMUSCULAR | Status: DC | PRN
  Administered 2017-03-31: 13 mL via EPIDURAL

## 2017-03-31 MED ORDER — DIPHENHYDRAMINE HCL 50 MG/ML IJ SOLN: 12.5000 mg | INTRAMUSCULAR | Status: DC | PRN

## 2017-03-31 MED ORDER — OXYTOCIN BOLUS FROM INFUSION
500.0000 mL | Freq: Once | INTRAVENOUS | Status: AC
  Administered 2017-03-31: 500 mL via INTRAVENOUS

## 2017-03-31 MED ORDER — FENTANYL 2.5 MCG/ML BUPIVACAINE 1/10 % EPIDURAL INFUSION (WH - ANES)
14.0000 mL/h | INTRAMUSCULAR | Status: DC | PRN
  Administered 2017-03-31: 14 mL/h via EPIDURAL
  Filled 2017-03-31: qty 100

## 2017-03-31 MED ORDER — BENZOCAINE-MENTHOL 20-0.5 % EX AERO
1.0000 "application " | INHALATION_SPRAY | CUTANEOUS | Status: DC | PRN
  Administered 2017-03-31: 1 via TOPICAL
  Filled 2017-03-31: qty 56

## 2017-03-31 MED ORDER — TERBUTALINE SULFATE 1 MG/ML IJ SOLN
0.2500 mg | Freq: Once | INTRAMUSCULAR | Status: DC | PRN
  Filled 2017-03-31: qty 1

## 2017-03-31 NOTE — Anesthesia Pain Management Evaluation Note (Signed)
  CRNA Pain Management Visit Note  Patient: Sandra RubensteinDuyen Berry, 36 y.o., female  "Hello I am a member of the anesthesia team at [facility]. We have an anesthesia team available at all times to provide care throughout the hospital, including epidural management and anesthesia for C-section. I don't know your plan for the delivery whether it a natural birth, water birth, IV sedation, nitrous supplementation, doula or epidural, but we want to meet your pain goals."   1.Was your pain managed to your expectations on prior hospitalizations?   Yes   2.What is your expectation for pain management during this hospitalization?     Labor support without medications  3.How can we help you reach that goal? unsure  Record the patient's initial score and the patient's pain goal.   Pain: 0  Pain Goal: 5 [facility] wants you to be able to say your pain was always managed very well.  Cephus ShellingBURGER,Emaree Chiu 03/31/2017

## 2017-03-31 NOTE — H&P (Signed)
Sandra Berry is a 36 y.o. female presenting for IOL.  [id number] at 40.0 with h/o uterine scar (prior c/s) here for IOL.  She is s/p vbac x1 and desires TOLAC.  She denies any ctx, lof, vaginal bleeding, +FM.  Antepartum course complicated by ptc and did receive bmz x2.  Pt also had eval for vaginal bleeding and growth u/s was wnl (6 wks ago) and EFW 33%.  She had no further episodes of bleeding.  She does speak [place] and has had interpreter with her for all visits and also today (mother).  [facility] at [facility] since 10 wks History OB History    Gravida Para Term Preterm AB Living   3 2 1  0 0 2   SAB TAB Ectopic Multiple Live Births   0 0 0 0 2     Past Medical History:  Diagnosis Date  . Indication for care in labor or delivery 01/02/2014  . Medical history non-contributory    Past Surgical History:  Procedure Laterality Date  . CESAREAN SECTION     Family History: family history includes Diabetes in her maternal grandmother; Heart attack in her maternal grandmother; Heart disease in her maternal grandmother; Hypertension in her maternal grandmother. Social History:  reports that  has never smoked. she has never used smokeless tobacco. Her alcohol and drug histories are not on file.  ROS  Dilation: 4 Effacement (%): 60 Station: -2 Exam by:: Dr Murrell Redden Blood pressure 108/74, pulse 72, temperature 97.7 F (36.5 C), temperature source Oral, resp. rate 16, height 5' 2"  (1.575 m), weight 156 lb (70.8 kg), last menstrual period 06/24/2016, unknown if currently breastfeeding. Exam Physical Exam  Prenatal labs: ABO, Rh: --/--/B POS (11/24 0800) Antibody: NEG (11/24 0800) Rubella: Immune (05/07 0000) RPR: Nonreactive (05/07 0000)  HBsAg: Negative (05/07 0000)  HIV: Non-reactive (05/07 0000)  GBS: Negative (10/24 0000)  1 hr Glucola - 124 Genetic screening - declined Anatomy US - normal  Physical Exam:  A&O x 3 HEENT - grossly wnl Lungs : ctab CV : rrr Abdo : soft, nt,  gravid; efw 7 lbs Extr : trace edema, nt bilat  Pelvic : 9/16/-6, cephalic; arom with copious clear fluid  FHT: baseline 130s-140s, normal variability, +accels, no decels TOCO: irregular q 2-4  Assessment/Plan:  IUP at 40.0wga 1. Admit for IOL - h/o ltcs, desires tolac, pitocin IOL and watch closely, plan svd; pt with h/o vbac x1; pt aware of risk of uterine rupture with TOLAC and accepts risks 2. Rh pos 3. Rubella immune 4. gbs negative   Charyl Bigger 03/31/2017, 9:37 AM

## 2017-03-31 NOTE — Anesthesia Procedure Notes (Signed)
Epidural Patient location during procedure: OB Start time: 03/31/2017 11:58 AM End time: 03/31/2017 12:12 PM  Staffing Anesthesiologist: Lowella CurbMiller, Maryana Pittmon Ray, MD Performed: anesthesiologist   Preanesthetic Checklist Completed: patient identified, site marked, surgical consent, pre-op evaluation, timeout performed, IV checked, risks and benefits discussed and monitors and equipment checked  Epidural Patient position: sitting Prep: ChloraPrep Patient monitoring: heart rate, cardiac monitor, continuous pulse ox and blood pressure Approach: midline Location: L2-L3 Injection technique: LOR saline  Needle:  Needle type: Tuohy  Needle gauge: 17 G Needle length: 9 cm Needle insertion depth: 4 cm Catheter type: closed end flexible Catheter size: 20 Guage Catheter at skin depth: 7 cm Test dose: negative  Assessment Events: blood not aspirated, injection not painful, no injection resistance, negative IV test and no paresthesia  Additional Notes Reason for block:procedure for pain

## 2017-03-31 NOTE — Progress Notes (Signed)
No c/o, comfortable after epidural  Patient Vitals for the past 24 hrs:  BP Temp Temp src Pulse Resp Height Weight  03/31/17 1230 101/72 - - 78 16 - -  03/31/17 1225 104/69 - - 98 18 - -  03/31/17 1220 111/62 - - 100 18 - -  03/31/17 1216 108/69 - - 96 - - -  03/31/17 1213 - (!) 97.3 F (36.3 C) Oral - - - -  03/31/17 1211 112/67 - - 87 - - -  03/31/17 1210 110/68 - - 87 - - -  03/31/17 1207 110/75 - - 90 - - -  03/31/17 1205 (!) 118/92 - - 89 - - -  03/31/17 1129 106/69 - - 78 - - -  03/31/17 1126 - - - - 18 - -  03/31/17 1108 95/62 - - 79 - - -  03/31/17 1107 - - - - 18 - -  03/31/17 1037 105/78 - - 74 18 - -  03/31/17 1001 103/70 - - 73 - - -  03/31/17 1000 - 97.8 F (36.6 C) Oral - - - -  03/31/17 0924 108/74 - - 72 16 - -  03/31/17 0908 93/60 - - 82 18 - -  03/31/17 0836 92/60 - - 76 - - -  03/31/17 0800 97/60 - - 88 18 - -  03/31/17 0733 - - - - - 5\' 2"  (1.575 m) 156 lb (70.8 kg)  03/31/17 0724 107/64 97.7 F (36.5 C) Oral 79 18 - -   A&0x3 Normal respirations Abd: soft, nt, gravid Cx: 6/90/-1, cephalic; iupc placed  FHT:  130s, normal variability, +accels, no decels; one variable to 60 with return to baseline  TOCO: q 2 min  A/p: iup at 40.0; h/o c/s x1 1. Contin pitocin IOL, follow closely; making progress, anticipate vbac 2. Fetal status reassuring overall; contin efm 3. GBS neg 4. Rh pos

## 2017-03-31 NOTE — Lactation Note (Signed)
This note was copied from a baby's chart. Lactation Consultation Note  Patient Name: Girl Leanor RubensteinDuyen Ledgerwood ZOXWR'UToday's Date: 03/31/2017 Reason for consult: Initial assessment   Initial consult with mom of 7 hour old infant. Spoke with mom and GM with assistance of [employer] Interpreter Christina [phone]. GM speaks good [place]. Infant asleep in GM arms.   Enc mom to BF infant STS 8-12 x in 24 hours at first feeding cues. GM reports mom has supplemented all of her children in the hospital as mom has low milk supply. Reviewed supply and demand and importance of putting infant to the breast before offering formula to stimulate the breast. Offered mom and breast pump and she declined.  LC Brochure and BF Resources handout given, mom informed of IP/OP Services, BF Support Groups and LC phone #. [facility] and is aware to call them if not seen in the hospital after d/c.   Offered BF assistance and family said that this is her third baby and knows how to BF. Enc mom to call out for feeding assistance as needed.         Maternal Data Formula Feeding for Exclusion: No Has patient been taught Hand Expression?: Yes Does the patient have breastfeeding experience prior to this delivery?: Yes  Feeding    LATCH Score                   Interventions Interventions: Breast feeding basics reviewed;Support pillows;Hand express  Lactation Tools Discussed/Used WIC Program: Yes   Consult Status Consult Status: Follow-up Date: 04/01/17 Follow-up type: In-patient    Silas FloodSharon S Franshesca Chipman 03/31/2017, 9:32 PM

## 2017-03-31 NOTE — Anesthesia Preprocedure Evaluation (Signed)
Anesthesia Evaluation  Patient identified by MRN, date of birth, ID band Patient awake    Reviewed: Allergy & Precautions, H&P , NPO status , Patient's Chart, lab work & pertinent test results  Airway Mallampati: I  TM Distance: >3 FB Neck ROM: full    Dental no notable dental hx.    Pulmonary neg pulmonary ROS,    Pulmonary exam normal        Cardiovascular negative cardio ROS Normal cardiovascular exam     Neuro/Psych negative neurological ROS  negative psych ROS   GI/Hepatic negative GI ROS, Neg liver ROS,   Endo/Other  negative endocrine ROS  Renal/GU negative Renal ROS     Musculoskeletal   Abdominal Normal abdominal exam  (+)   Peds  Hematology negative hematology ROS (+)   Anesthesia Other Findings   Reproductive/Obstetrics (+) Pregnancy                             Anesthesia Physical  Anesthesia Plan  ASA: II  Anesthesia Plan: Epidural   Post-op Pain Management:    Induction:   PONV Risk Score and Plan:   Airway Management Planned:   Additional Equipment:   Intra-op Plan:   Post-operative Plan:   Informed Consent: I have reviewed the patients History and Physical, chart, labs and discussed the procedure including the risks, benefits and alternatives for the proposed anesthesia with the patient or authorized representative who has indicated his/her understanding and acceptance.     Plan Discussed with:   Anesthesia Plan Comments:         Anesthesia Quick Evaluation

## 2017-04-01 LAB — CBC
HCT: 34 % — ABNORMAL LOW (ref 36.0–46.0)
HEMOGLOBIN: 11.4 g/dL — AB (ref 12.0–15.0)
MCH: 32.3 pg (ref 26.0–34.0)
MCHC: 33.5 g/dL (ref 30.0–36.0)
MCV: 96.3 fL (ref 78.0–100.0)
Platelets: 129 10*3/uL — ABNORMAL LOW (ref 150–400)
RBC: 3.53 MIL/uL — ABNORMAL LOW (ref 3.87–5.11)
RDW: 13.7 % (ref 11.5–15.5)
WBC: 10.2 10*3/uL (ref 4.0–10.5)

## 2017-04-01 MED ORDER — COCONUT OIL OIL: 1.0000 "application " | TOPICAL_OIL | 0 refills | Status: AC | PRN

## 2017-04-01 MED ORDER — ACETAMINOPHEN 325 MG PO TABS: 650.0000 mg | ORAL_TABLET | ORAL | Status: AC | PRN

## 2017-04-01 MED ORDER — BENZOCAINE-MENTHOL 20-0.5 % EX AERO: 1.0000 "application " | INHALATION_SPRAY | CUTANEOUS | Status: AC | PRN

## 2017-04-01 MED ORDER — IBUPROFEN 600 MG PO TABS: 600.0000 mg | ORAL_TABLET | Freq: Four times a day (QID) | ORAL | 0 refills | Status: AC

## 2017-04-01 NOTE — Progress Notes (Signed)
PPD # 1 SVD Information for the patient's newborn:  Sandra Berry, Girl [name] [[id number]][[id number]]  female      breast and bottle feeding Baby name: [name]  S:  Reports feeling well, desires DC home. Mother acting as interpreter, patient able to understand English and communicate adequately             Tolerating po/ No nausea or vomiting             Bleeding is light, moderate             Pain controlled with ibuprofen (OTC)             Up ad lib / ambulatory / voiding without difficulties        O:  A & O x 3, in no apparent distress              VS:  Vitals:   03/31/17 1523 03/31/17 1547 03/31/17 1700 04/01/17 0519  BP: (!) 95/54 (!) 98/56 (!) 96/54 (!) 93/54  Pulse: 78 69 69 66  Resp: 18 17 18 16   Temp: 98 F (36.7 C) 98.2 F (36.8 C) 98 F (36.7 C) 97.8 F (36.6 C)  TempSrc: Oral Oral Oral Oral  Weight:      Height:        LABS:  Recent Labs    03/31/17 0800 04/01/17 0542  WBC 6.6 10.2  HGB 12.5 11.4*  HCT 38.1 34.0*  PLT 152 129*    Blood type: --/--/B POS (11/24 0800)  Rubella: Immune (05/07 0000)   I&O: I/O last 3 completed shifts: In: -  Out: 851 [Urine:451; Blood:400]          No intake/output data recorded.  Lungs: Clear and unlabored  Heart: regular rate and rhythm / no murmurs  Abdomen: soft, non-tender, non-distended             Fundus: firm, non-tender, U-1  Perineum: repair intact, no edema  Lochia: small  Extremities: no edema, no calf pain or tenderness    A/P: PPD # 1 36 y.o., [id number]   Principal Problem:   VBAC, delivered Active Problems:   Postpartum care following vaginal delivery    Second-degree perineal laceration, with delivery Mild thrombocytopenia  Asymptomatic   Doing well - stable status  Routine post partum orders  DC home today w/ instructions  [facility] and [facility]N    Neta Mendsaniela C Maye Parkinson, MSN, CNM 04/01/2017, 9:08 AM

## 2017-04-01 NOTE — Progress Notes (Addendum)
In room to check on patient and she is sleeping. Grandmother awake at bedside and she states that mom has decided to exclusively formula feed as she does not feel her milk coming in yet. Advised grandmother that it is normal for mom to not feel milk coming in for 2-3 days on average. Grandmother states they would still like to formula-feed. Will follow-up with patient when she is awake.

## 2017-04-01 NOTE — Anesthesia Postprocedure Evaluation (Signed)
Anesthesia Post Note  Patient: Sandra Berry  Procedure(s) Performed: AN AD HOC LABOR EPIDURAL     Patient location during evaluation: Mother Baby Anesthesia Type: Epidural Level of consciousness: awake Pain management: satisfactory to patient Vital Signs Assessment: post-procedure vital signs reviewed and stable Respiratory status: spontaneous breathing Cardiovascular status: stable Anesthetic complications: no    Last Vitals:  Vitals:   03/31/17 1700 04/01/17 0519  BP: (!) 96/54 (!) 93/54  Pulse: 69 66  Resp: 18 16  Temp: 36.7 C 36.6 C    Last Pain:  Vitals:   04/01/17 0519  TempSrc: Oral  PainSc: 2    Pain Goal:       Report from patien'st nurse          Cephus ShellingBURGER,Jacklyne Baik

## 2017-04-01 NOTE — Discharge Summary (Signed)
Obstetric Discharge Summary Reason for Admission: onset of labor and TOLAC Prenatal Procedures: ultrasound and betamethasone course for preterm contractions Intrapartum Procedures: spontaneous vaginal delivery and VBAC, epidural Postpartum Procedures: none Complications-Operative and Postpartum: 2nd degree perineal laceration Hemoglobin  Date Value Ref Range Status  04/01/2017 11.4 (L) 12.0 - 15.0 g/dL Final   HCT  Date Value Ref Range Status  04/01/2017 34.0 (L) 36.0 - 46.0 % Final    Physical Exam:  See progress note  Discharge Diagnoses: Term Pregnancy-delivered and VBAC  Discharge Information: Date: 04/01/2017 Activity: pelvic rest Diet: routine Medications:  Allergies as of 04/01/2017   No Known Allergies     Medication List    TAKE these medications   acetaminophen 325 MG tablet Commonly known as:  TYLENOL Take 2 tablets (650 mg total) by mouth every 4 (four) hours as needed (for pain scale < 4).   benzocaine-Menthol 20-0.5 % Aero Commonly known as:  DERMOPLAST Apply 1 application topically as needed for irritation (perineal discomfort).   coconut oil Oil Apply 1 application topically as needed.   ibuprofen 600 MG tablet Commonly known as:  ADVIL,MOTRIN Take 1 tablet (600 mg total) by mouth every 6 (six) hours.   prenatal multivitamin Tabs tablet Take 1 tablet by mouth at bedtime.            Discharge Care Instructions  (From admission, onward)        Start     Ordered   04/01/17 0000  Discharge wound care:    Comments:  Sitz baths 2 times /day with warm water x 1 week   04/01/17 1025     Condition: stable Instructions: refer to practice specific booklet Discharge to: home Follow-up Information    Amado NashAlmquist, Candace GallusSusan E, MD. Schedule an appointment as soon as possible for a visit in 6 week(s).   Specialty:  Obstetrics and Gynecology Contact information: [address] [place] [address] [phone]           Newborn Data: Live  born female [name] Birth Weight: 8 lb 0.8 oz (3651 g) APGAR: 9, 9  Newborn Delivery   Birth date/time:  03/31/2017 13:40:00 Delivery type:  VBAC, Spontaneous     Home with mother.  Neta MendsDaniela C Diangelo Radel, CNM 04/01/2017, 10:25 AM

## 2018-03-23 IMAGING — US US MFM OB COMP +14 WKS
1 series · 13 of 28 positions shown · non-contrast
Comparison: none

[Series 1: us mfm ob comp +14 wks · 13 of 65 slices shown]
[im 3/65]
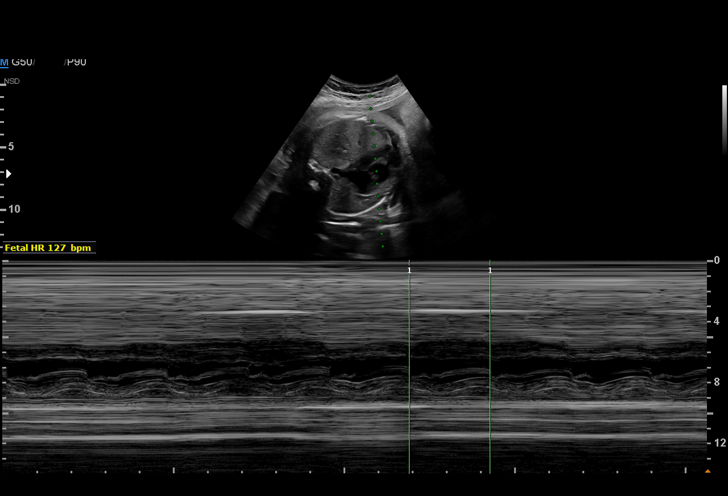
[im 8/65]
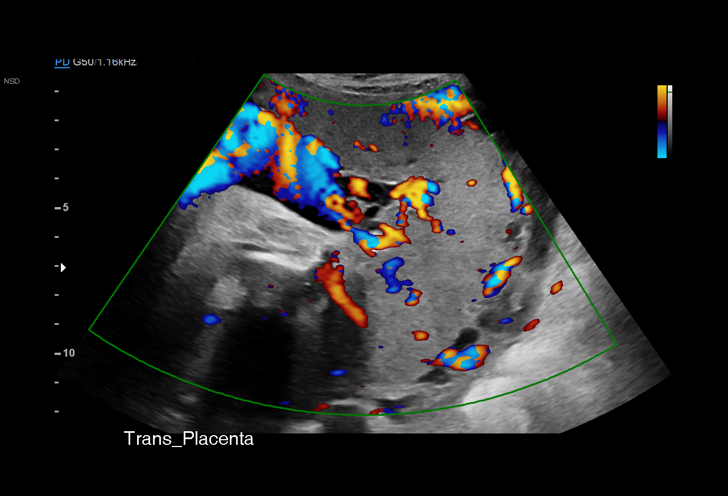
[im 12/65]
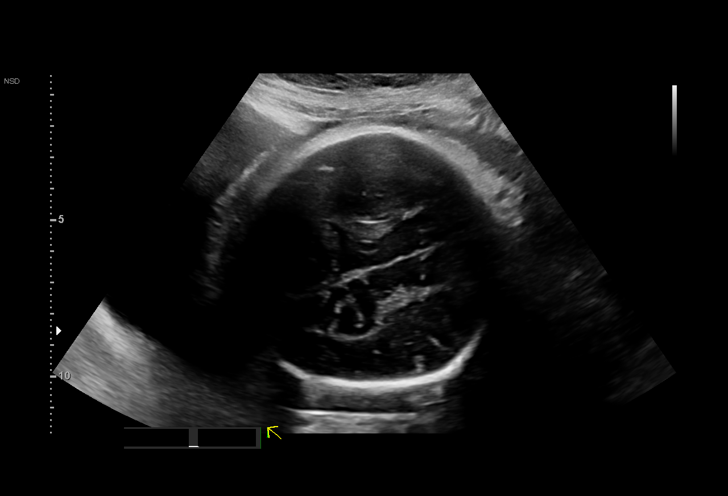
[im 17/65]
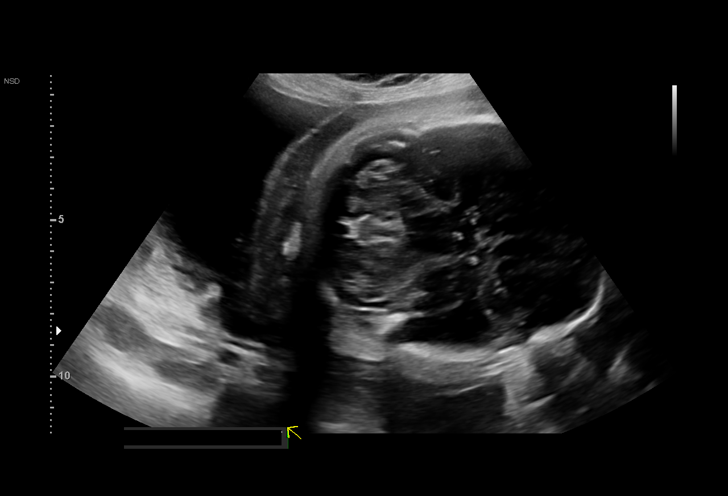
[im 22/65]
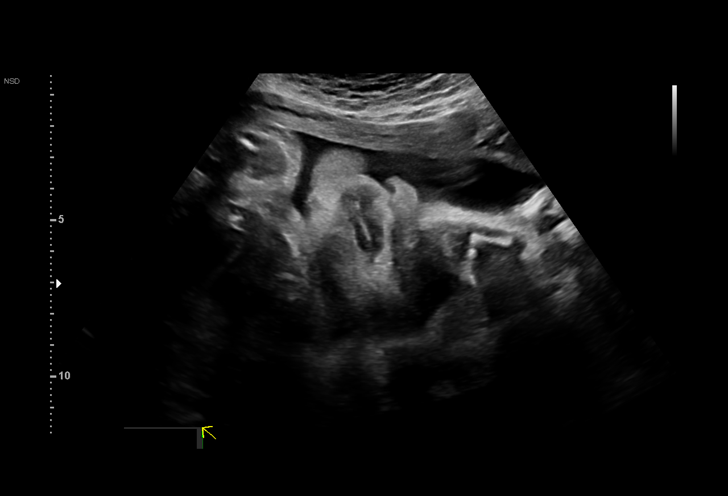
[im 27/65]
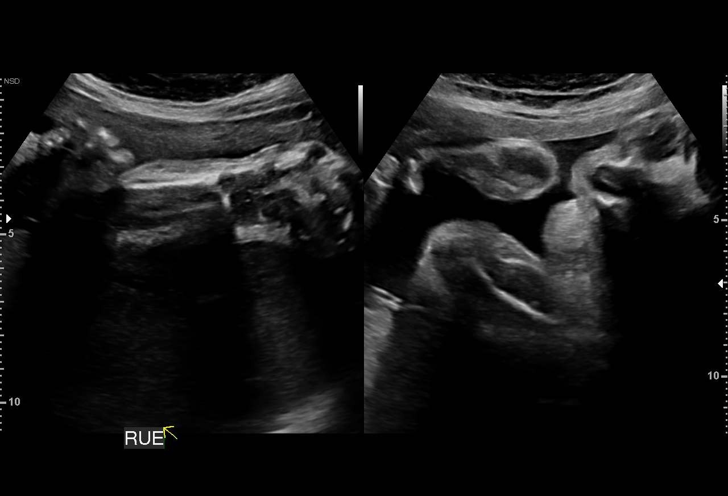
[im 34/65]
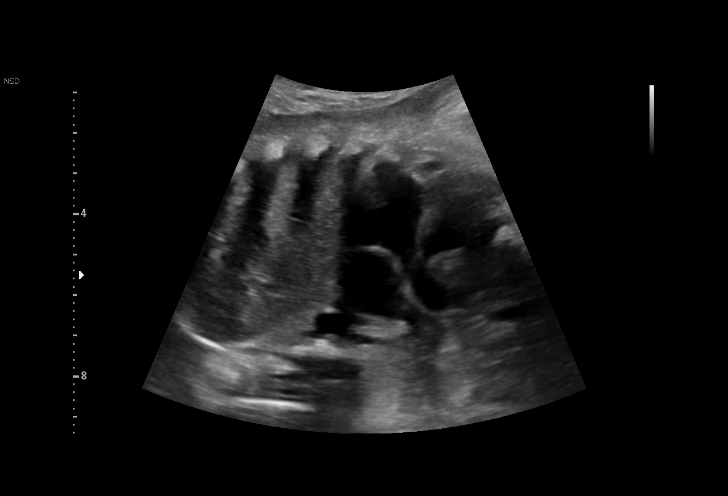
[im 38/65]
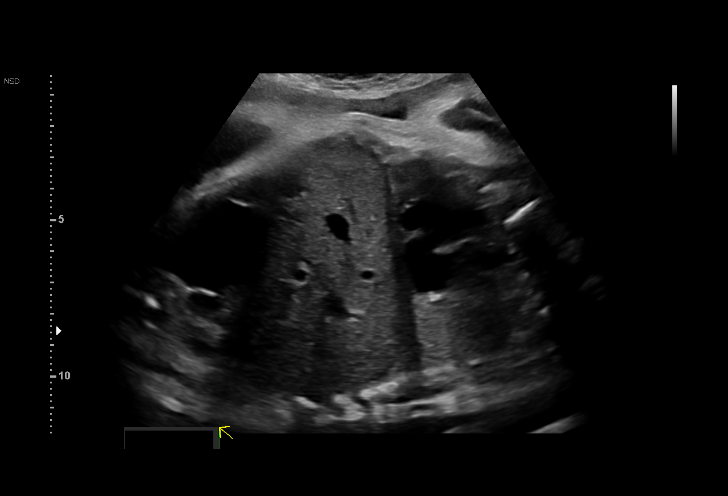
[im 43/65]
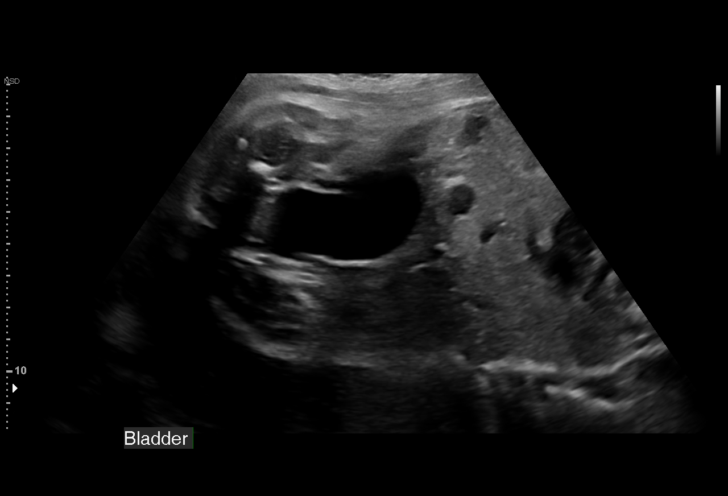
[im 48/65]
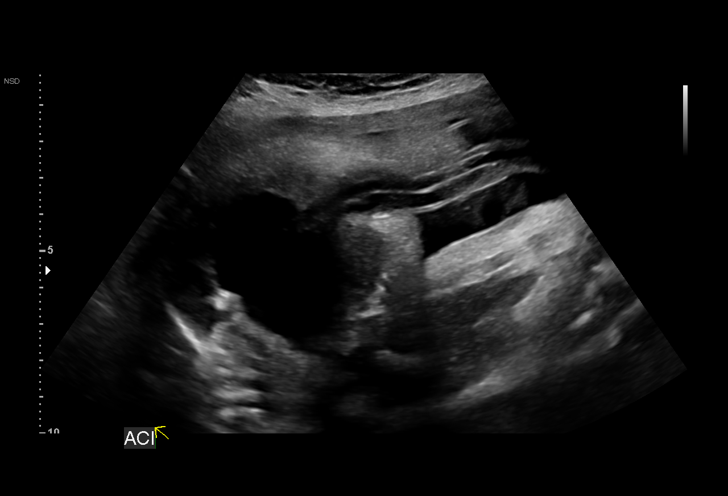
[im 53/65]
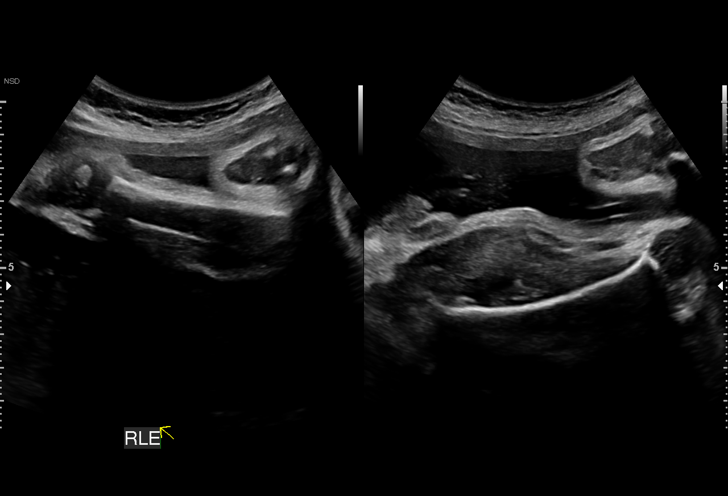
[im 57/65]
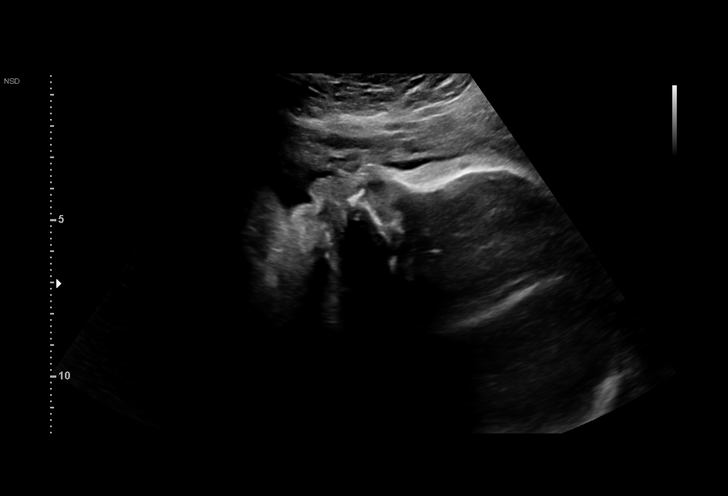
[im 62/65]
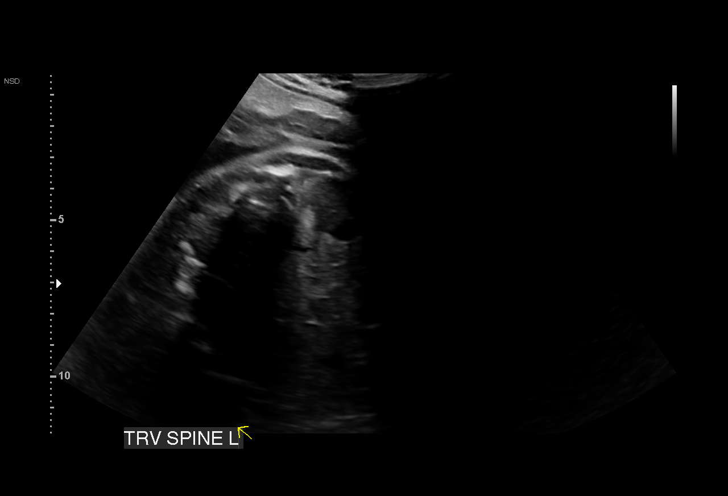

[13 of 28 positions shown; findings below may reference images not displayed]

[facility]
MAU/Triage

1  HARD               [id number]      [id number]     [id number]
DIQ
Indications

34 weeks gestation of pregnancy
Vaginal bleeding in pregnancy, third trimester
Encounter for other specified antenatal
screening ([facility])
OB History

Gravidity:    3         Term:   2        Prem:   0        SAB:   0
TOP:          0       Ectopic:  0        Living: 2
Fetal Evaluation

Num Of Fetuses:     1
Fetal Heart         127
Rate(bpm):
Cardiac Activity:   Observed
Presentation:       Cephalic
Placenta:           Fundal, above cervical os
P. Cord Insertion:  Not well visualized

Amniotic Fluid
AFI FV:      Subjectively within normal limits

AFI Sum(cm)     %Tile       Largest Pocket(cm)
12.26           36

RUQ(cm)       RLQ(cm)       LUQ(cm)        LLQ(cm)
2.52
Biometry

BPD:      83.2  mm     G. Age:  33w 3d         25  %    CI:        78.09   %    70 - 86
FL/HC:      20.6   %    19.4 -
HC:      297.9  mm     G. Age:  33w 0d        < 3  %    HC/AC:      1.02        0.96 -
AC:      292.4  mm     G. Age:  33w 2d         25  %    FL/BPD:     73.9   %    71 - 87
FL:       61.5  mm     G. Age:  31w 6d        < 3  %    FL/AC:      21.0   %    20 - 24
HUM:      57.4  mm     G. Age:  33w 2d         40  %

Est. FW:    3880  gm      4 lb 9 oz     32  %
Gestational Age

Clinical EDD:  34w 2d                                        EDD:   03/29/17
U/S Today:     32w 6d                                        EDD:   04/08/17
Best:          34w 2d     Det. By:  Clinical EDD             EDD:   03/29/17
Anatomy

Cranium:               Appears normal         LVOT:                   Appears normal
Cavum:                 Appears normal         Aortic Arch:            Not well visualized
Ventricles:            Not well visualized    Ductal Arch:            Not well visualized
Choroid Plexus:        Appears normal         Diaphragm:              Appears normal
Cerebellum:            Appears normal         Stomach:                Appears normal, left
sided
Posterior Fossa:       Not well visualized    Abdomen:                Appears normal
Nuchal Fold:           Not applicable (>20    Abdominal Wall:         Appears nml (cord
wks GA)                                        insert, abd wall)
Face:                  Appears normal         Cord Vessels:           Appears normal (3
(orbits and profile)                           vessel cord)
Lips:                  Appears normal         Kidneys:                Appear normal
Palate:                Not well visualized    Bladder:                Appears normal
Thoracic:              Appears normal         Spine:                  Not well visualized
Heart:                 Appears normal         Upper Extremities:      Visualized
(4CH, axis, and situs
RVOT:                  Not well visualized    Lower Extremities:      Visualized

Other:  Complete fetal anatomic survey previously performed in office. Fetus
appears to be a female. Technically difficult due to advanced GA and
fetal position.
Cervix Uterus Adnexa

Cervix
Normal appearance by transabdominal scan.
Impression

Singleton intrauterine pregnancy at 34+2 weeks with vaginal
bleeding
Review of the anatomy shows no sonographic markers for
aneuploidy or structural anomalies
However, evaluations should be considered suboptimal
secondary to late gestation and fetal position
Placentation normal  without previa or markers for abruption
Amniotic fluid volume is normal with an AFI of 12.3 cm
Estimated fetal weight is 2063g which is growth in the 32nd
percentile
Recommendations
Continue clinical evaluation and management
